# Patient Record
Sex: Female | Born: 2000 | Race: White | Hispanic: No | Marital: Single | Smoking: Never smoker
Health system: Southern US, Community
[De-identification: ages and names within clinical notes are randomized; demographics above are authoritative.]

## PROBLEM LIST (undated history)

## (undated) HISTORY — PX: APPENDECTOMY: SHX54

---
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
## 2020-05-20 ENCOUNTER — Other Ambulatory Visit: Payer: PRIVATE HEALTH INSURANCE

## 2020-05-20 DIAGNOSIS — Z20822 Contact with and (suspected) exposure to covid-19: Secondary | ICD-10-CM

## 2020-05-21 LAB — SARS-COV-2, NAA 2 DAY TAT

## 2020-05-21 LAB — NOVEL CORONAVIRUS, NAA: SARS-CoV-2, NAA: DETECTED — AB

## 2022-05-10 ENCOUNTER — Other Ambulatory Visit: Payer: Self-pay

## 2022-05-10 ENCOUNTER — Encounter: Payer: Self-pay | Admitting: Nurse Practitioner

## 2022-05-10 ENCOUNTER — Ambulatory Visit: Payer: No Typology Code available for payment source | Admitting: Nurse Practitioner

## 2022-05-10 VITALS — BP 130/80 | HR 92 | Temp 98.4°F | Resp 18 | Ht 66.0 in | Wt 224.6 lb

## 2022-05-10 DIAGNOSIS — Z114 Encounter for screening for human immunodeficiency virus [HIV]: Secondary | ICD-10-CM

## 2022-05-10 DIAGNOSIS — Z7689 Persons encountering health services in other specified circumstances: Secondary | ICD-10-CM

## 2022-05-10 DIAGNOSIS — Z6836 Body mass index (BMI) 36.0-36.9, adult: Secondary | ICD-10-CM

## 2022-05-10 DIAGNOSIS — I10 Essential (primary) hypertension: Secondary | ICD-10-CM

## 2022-05-10 DIAGNOSIS — Z23 Encounter for immunization: Secondary | ICD-10-CM | POA: Diagnosis not present

## 2022-05-10 DIAGNOSIS — Z3009 Encounter for other general counseling and advice on contraception: Secondary | ICD-10-CM

## 2022-05-10 DIAGNOSIS — Z13220 Encounter for screening for lipoid disorders: Secondary | ICD-10-CM

## 2022-05-10 DIAGNOSIS — Z1159 Encounter for screening for other viral diseases: Secondary | ICD-10-CM

## 2022-05-10 DIAGNOSIS — N946 Dysmenorrhea, unspecified: Secondary | ICD-10-CM

## 2022-05-10 DIAGNOSIS — F331 Major depressive disorder, recurrent, moderate: Secondary | ICD-10-CM

## 2022-05-10 DIAGNOSIS — Z131 Encounter for screening for diabetes mellitus: Secondary | ICD-10-CM

## 2022-05-10 LAB — COMPLETE METABOLIC PANEL WITH GFR
AG Ratio: 2.1 (calc) (ref 1.0–2.5)
ALT: 14 U/L (ref 6–29)
AST: 15 U/L (ref 10–30)
Albumin: 4.6 g/dL (ref 3.6–5.1)
Globulin: 2.2 g/dL (calc) (ref 1.9–3.7)

## 2022-05-10 LAB — CBC WITH DIFFERENTIAL/PLATELET
Basophils Absolute: 40 cells/uL (ref 0–200)
Basophils Relative: 0.5 %
Eosinophils Relative: 0.6 %
Lymphs Abs: 1501 cells/uL (ref 850–3900)
MCH: 31.8 pg (ref 27.0–33.0)
MCHC: 35.5 g/dL (ref 32.0–36.0)
MCV: 89.5 fL (ref 80.0–100.0)
MPV: 10.2 fL (ref 7.5–12.5)
Monocytes Relative: 6.6 %
Neutro Abs: 5791 cells/uL (ref 1500–7800)
Platelets: 250 10*3/uL (ref 140–400)

## 2022-05-10 LAB — LIPID PANEL
Cholesterol: 144 mg/dL (ref <200)
HDL: 41 mg/dL — ABNORMAL LOW (ref >=50)
LDL Cholesterol (Calc): 82 mg/dL (calc)
Non-HDL Cholesterol (Calc): 103 mg/dL (calc) (ref <130)
Total CHOL/HDL Ratio: 3.5 (calc) (ref <5.0)

## 2022-05-10 MED ORDER — ESCITALOPRAM OXALATE 10 MG PO TABS: 10.0000 mg | ORAL_TABLET | Freq: Every day | ORAL | 0 refills | Status: DC

## 2022-05-10 MED ORDER — LISINOPRIL 2.5 MG PO TABS: 2.5000 mg | ORAL_TABLET | Freq: Every day | ORAL | 0 refills | Status: DC

## 2022-05-10 NOTE — Progress Notes (Signed)
BP 130/80   Pulse 92   Temp 98.4 F (36.9 C) (Oral)   Resp 18   Ht 5\' 6"  (1.676 m)   Wt 224 lb 9.6 oz (101.9 kg)   LMP 04/21/2022   SpO2 99%   BMI 36.25 kg/m    Subjective:    Patient ID: Autumn [name], female    DOB: 11-09-2000, 21 y.o.   MRN: [id number]  HPI: Autumn [name] is a 21 y.o. 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  She has not had a pap. [facility]: end of august  Hypertension: She says [name] face was really red and headaches, she checked [name] blood pressure and it was 160s. She was seen at urgent care on 03/18/2022. She was diagnosed with hypertension and started on lisinopril 2.5 mg daily.  [name] blood pressure today is 130/80.  She says she has been out of medication for about 2 weeks.  Patient reports since being off the medication that [name] symptoms have returned. Will send in refills.   Obesity: [name] current weight is 224 lbs with a BMI of 36.25. She can work on eating well balanced with portion control and increasing physical activity.   Birth control/dysmenorrhea: [facility]: end of august, regular. She says that [name] cramps are bad and makes [name] nauseated. She says she would like to try birth control to help with symptoms.  She is also sexually active.  She would like to do an IUD. Will placed referral to GYN.   Depression/anxiety: She says she has struggled with anxiety and depression for years. She has never had it addressed.  [name] PHQ9 and GAD scores are positive. Discussed treatment options include therapy. Patient would like to try lexapro.  Discussed side effects and close follow up in 4 weeks.     05/10/2022    9:21 AM  Depression screen PHQ 2/9  Decreased Interest 2  Down, Depressed, Hopeless 2  PHQ - 2 Score 4  Altered sleeping 3  Tired, decreased energy 3  Change in appetite 3  Feeling bad or  failure about yourself  2  Trouble concentrating 2  Moving slowly or fidgety/restless 3  Suicidal thoughts 1  PHQ-9 Score 21  Difficult doing work/chores Very difficult       05/10/2022    9:23 AM  GAD 7 : Generalized Anxiety Score  Nervous, Anxious, on Edge 3  Control/stop worrying 2  Worry too much - different things 3  Trouble relaxing 3  Restless 3  Easily annoyed or irritable 3  Afraid - awful might happen 3  Total GAD 7 Score 20  Anxiety Difficulty Very difficult     Relevant past medical, surgical, family and social history reviewed and updated as indicated. Interim medical history since our last visit reviewed. Allergies and medications reviewed and updated.  Review of Systems  Constitutional: Negative for fever or weight change.  Respiratory: Negative for cough and shortness of breath.   Cardiovascular: Negative for chest pain or palpitations.  Gastrointestinal: Negative for abdominal pain, no bowel changes.  Musculoskeletal: Negative for gait problem or joint swelling.  Skin: Negative for rash.  Neurological: Negative for dizziness or headache.  No other specific complaints in a complete review of systems (except as listed in HPI above).  Objective:    BP 130/80   Pulse 92   Temp 98.4 F (36.9 C) (Oral)   Resp 18   Ht 5\' 6"  (1.676 m)   Wt 224 lb 9.6 oz (101.9 kg)   LMP 04/21/2022   SpO2 99%   BMI 36.25 kg/m   Wt Readings from Last 3 Encounters:  05/10/22 224 lb 9.6 oz (101.9 kg)  06/21/16 190 lb 9.6 oz (86.5 kg) (97 %, Z= 1.90)*   * Growth percentiles are based on CDC (Girls, 2-20 Years) data.    Physical Exam  Constitutional: Patient appears well-developed and well-nourished. Obese  No distress.  HEENT: head atraumatic, normocephalic, pupils equal and reactive to light, neck supple Cardiovascular: Normal rate, regular rhythm and normal heart sounds.  No murmur heard. No BLE edema. Pulmonary/Chest: Effort normal and breath sounds normal. No  respiratory distress. Abdominal: Soft.  There is no tenderness. Psychiatric: Patient has a normal mood and affect. behavior is normal. Judgment and thought content normal.  Results for orders placed or performed in visit on 05/20/20  SARS-COV-2, NAA 2 DAY TAT   Nasopharynge  Screenin  Result Value Ref Range   SARS-CoV-2, NAA 2 DAY TAT Performed   Novel Coronavirus, NAA ([facility])   Specimen: Nasopharyngeal(NP) swabs in vial transport medium   Nasopharynge  Screenin  Result Value Ref Range   SARS-CoV-2, NAA Detected (A) Not Detected      Assessment & Plan:   Problem List Items Addressed This Visit       Cardiovascular and Mediastinum   Hypertension - Primary    Continue taking lisinopril 2.5 mg daily.  Try and reduce sodium in your diet.       Relevant Medications   lisinopril (ZESTRIL) 2.5 MG tablet   Other Relevant Orders   CBC with Differential/Platelet   COMPLETE METABOLIC PANEL WITH GFR     Other   Class 2 severe obesity due to excess calories with serious comorbidity and body mass index (BMI) of 36.0 to 36.9 in adult [facility])    Work on increasing physical activity, well balanced diet with portion control.       Other Visit Diagnoses     Need for Tdap vaccination       Relevant Orders   Tdap vaccine greater than or equal to 7yo IM (Completed)   Encounter to establish care       schedule cpe    Birth control counseling       Relevant Orders   Ambulatory referral to Gynecology   Encounter for hepatitis C screening test for low risk patient       Relevant Orders   Hepatitis C antibody   Screening for HIV without presence of risk factors       Relevant Orders   HIV Antibody (routine testing w rflx)   Screening for cholesterol level       Relevant Orders   Lipid panel   Screening for diabetes mellitus       Relevant Orders   COMPLETE METABOLIC PANEL WITH GFR   Hemoglobin A1c   Moderate episode of recurrent major depressive disorder ([place])       Relevant  Medications   escitalopram (LEXAPRO) 10 MG tablet   Dysmenorrhea       Relevant Orders   Ambulatory referral to Gynecology        Follow up plan: Return in about 4 weeks (around 06/07/2022) for follow up.

## 2022-05-10 NOTE — Assessment & Plan Note (Signed)
Work on increasing physical activity, well balanced diet with portion control.

## 2022-05-10 NOTE — Assessment & Plan Note (Signed)
Continue taking lisinopril 2.5 mg daily.  Try and reduce sodium in your diet.

## 2022-05-11 LAB — CBC WITH DIFFERENTIAL/PLATELET
Absolute Monocytes: 521 cells/uL (ref 200–950)
Eosinophils Absolute: 47 cells/uL (ref 15–500)
HCT: 42.5 % (ref 35.0–45.0)
Hemoglobin: 15.1 g/dL (ref 11.7–15.5)
Neutrophils Relative %: 73.3 %
RBC: 4.75 10*6/uL (ref 3.80–5.10)
RDW: 11.8 % (ref 11.0–15.0)
Total Lymphocyte: 19 %
WBC: 7.9 10*3/uL (ref 3.8–10.8)

## 2022-05-11 LAB — COMPLETE METABOLIC PANEL WITH GFR
Alkaline phosphatase (APISO): 58 U/L (ref 31–125)
BUN: 11 mg/dL (ref 7–25)
CO2: 29 mmol/L (ref 20–32)
Calcium: 9.7 mg/dL (ref 8.6–10.2)
Chloride: 105 mmol/L (ref 98–110)
Creat: 0.81 mg/dL (ref 0.50–0.96)
Glucose, Bld: 65 mg/dL (ref 65–99)
Potassium: 4.5 mmol/L (ref 3.5–5.3)
Sodium: 141 mmol/L (ref 135–146)
Total Bilirubin: 0.6 mg/dL (ref 0.2–1.2)
Total Protein: 6.8 g/dL (ref 6.1–8.1)
eGFR: 105 mL/min/{1.73_m2} (ref >=60)

## 2022-05-11 LAB — HEMOGLOBIN A1C
Hgb A1c MFr Bld: 4.4 % of total Hgb (ref <5.7)
Mean Plasma Glucose: 80 mg/dL
eAG (mmol/L): 4.4 mmol/L

## 2022-05-11 LAB — LIPID PANEL: Triglycerides: 117 mg/dL (ref <150)

## 2022-05-11 LAB — HEPATITIS C ANTIBODY: Hepatitis C Ab: NONREACTIVE

## 2022-05-11 LAB — HIV ANTIBODY (ROUTINE TESTING W REFLEX): HIV 1&2 Ab, 4th Generation: NONREACTIVE

## 2022-05-14 ENCOUNTER — Telehealth: Payer: Self-pay | Admitting: Obstetrics and Gynecology

## 2022-05-14 NOTE — Telephone Encounter (Signed)
[facility] referring for Birth control counseling,Dysmenorrhea, interested in IUD prefers female provider. I contacted patient via phone. I left voicemail for patient to call back to be scheduled.

## 2022-05-14 NOTE — Telephone Encounter (Signed)
Patient is scheduled for 05/26/22 with CJE

## 2022-05-26 ENCOUNTER — Encounter: Payer: No Typology Code available for payment source | Admitting: Obstetrics & Gynecology

## 2022-06-01 ENCOUNTER — Other Ambulatory Visit: Payer: Self-pay | Admitting: Nurse Practitioner

## 2022-06-01 DIAGNOSIS — F331 Major depressive disorder, recurrent, moderate: Secondary | ICD-10-CM

## 2022-06-01 NOTE — Telephone Encounter (Signed)
Requested medications are due for refill today.  Yes  Requested medications are on the active medications list.  yes  Last refill. 05/10/2022 #30 0 rf  Future visit scheduled.   Yes - in 6 days  Notes to clinic.  Pharmacy needs dx code. This is a new medication to pt. Please review for refill.    Requested Prescriptions  Pending Prescriptions Disp Refills   escitalopram (LEXAPRO) 10 MG tablet [Pharmacy Med Name: ESCITALOPRAM 10 MG TABLET] 90 tablet 1    Sig: TAKE 1 TABLET BY MOUTH EVERY DAY     Psychiatry:  Antidepressants - SSRI Passed - 06/01/2022  1:32 PM      Passed - Valid encounter within last 6 months    Recent Outpatient Visits           3 weeks ago Hypertension, unspecified type   [place], FNP       Future Appointments             In 6 days Reece Packer, Myna Hidalgo, [facility], [facility]

## 2022-06-07 ENCOUNTER — Encounter: Payer: Self-pay | Admitting: Nurse Practitioner

## 2022-06-07 ENCOUNTER — Ambulatory Visit: Payer: No Typology Code available for payment source | Admitting: Nurse Practitioner

## 2022-06-07 ENCOUNTER — Other Ambulatory Visit: Payer: Self-pay

## 2022-06-07 VITALS — BP 124/76 | HR 98 | Temp 98.4°F | Resp 18 | Ht 66.0 in | Wt 224.8 lb

## 2022-06-07 DIAGNOSIS — F419 Anxiety disorder, unspecified: Secondary | ICD-10-CM | POA: Diagnosis not present

## 2022-06-07 DIAGNOSIS — F331 Major depressive disorder, recurrent, moderate: Secondary | ICD-10-CM | POA: Insufficient documentation

## 2022-06-07 DIAGNOSIS — K219 Gastro-esophageal reflux disease without esophagitis: Secondary | ICD-10-CM | POA: Diagnosis not present

## 2022-06-07 MED ORDER — ESCITALOPRAM OXALATE 20 MG PO TABS: 20.0000 mg | ORAL_TABLET | Freq: Every day | ORAL | 0 refills | Status: DC

## 2022-06-07 MED ORDER — OMEPRAZOLE 20 MG PO CPDR: 20.0000 mg | DELAYED_RELEASE_CAPSULE | Freq: Every day | ORAL | 3 refills | Status: AC

## 2022-06-07 NOTE — Assessment & Plan Note (Signed)
Increase Lexapro to 20mg daily.  Follow up in 4 weeks. 

## 2022-06-07 NOTE — Assessment & Plan Note (Signed)
Start taking omeprazole 20 mg daily.  Try to identify food triggers and avoid them.  Do not lay down an hour after eating.

## 2022-06-07 NOTE — Progress Notes (Signed)
BP 124/76   Pulse 98   Temp 98.4 F (36.9 C) (Oral)   Resp 18   Ht _0  (1.676 m)   Wt 224 lb 12.8 oz (102 kg)   LMP 05/26/2022   SpO2 97%   BMI 36.28 kg/m    Subjective:    Patient ID: Autumn [name], female    DOB: 06/30/2001, 21 y.o.   MRN: [id number]  HPI: Autumn [name] is a 21 y.o. female  Chief Complaint  Patient presents with   Hypertension   Depression   Depression/anxiety: Patient reports that she is struggled with anxiety and depression for many years.  But it was never addressed.  Last appointment we started patient on Lexapro 10 mg daily.  Back today for 4-week follow-up. Patient's PHQ 9 and GAD scores have improved slightly. She says she feels like the medication does not work all day long.  Discussed increasing dose to 20 mg daily.      06/07/2022   10:23 AM 05/10/2022    9:21 AM  Depression screen PHQ 2/9  Decreased Interest 2 2  Down, Depressed, Hopeless 2 2  PHQ - 2 Score 4 4  Altered sleeping 3 3  Tired, decreased energy 2 3  Change in appetite 3 3  Feeling bad or failure about yourself  1 2  Trouble concentrating 2 2  Moving slowly or fidgety/restless 2 3  Suicidal thoughts 1 1  PHQ-9 Score 18 21  Difficult doing work/chores Somewhat difficult Very difficult       06/07/2022   10:26 AM 05/10/2022    9:23 AM  GAD 7 : Generalized Anxiety Score  Nervous, Anxious, on Edge 3 3  Control/stop worrying 2 2  Worry too much - different things 2 3  Trouble relaxing 1 3  Restless 1 3  Easily annoyed or irritable 2 3  Afraid - awful might happen 1 3  Total GAD 7 Score 12 20  Anxiety Difficulty Somewhat difficult Very difficult   GERD:  patient reports that she has been having a lot of acid reflux lately.  Last night it was the worst and she ended up throwing up.  She says she can feel the burning in [name] chest. She says she did try an omeprazole 20 mg which helped. Discussed avoiding triggers, not laying down an hour after eating. Will prescribe  omeprazole.     Relevant past medical, surgical, family and social history reviewed and updated as indicated. Interim medical history since our last visit reviewed. Allergies and medications reviewed and updated.  Review of Systems  Constitutional: Negative for fever or weight change.  Respiratory: Negative for cough and shortness of breath.   Cardiovascular: Negative for chest pain or palpitations.  Gastrointestinal: Negative for abdominal pain, no bowel changes.  Musculoskeletal: Negative for gait problem or joint swelling.  Skin: Negative for rash.  Neurological: Negative for dizziness or headache.  No other specific complaints in a complete review of systems (except as listed in HPI above).      Objective:    BP 124/76   Pulse 98   Temp 98.4 F (36.9 C) (Oral)   Resp 18   Ht _1  (1.676 m)   Wt 224 lb 12.8 oz (102 kg)   LMP 05/26/2022   SpO2 97%   BMI 36.28 kg/m   Wt Readings from Last 3 Encounters:  06/07/22 224 lb 12.8 oz (102 kg)  05/10/22 224 lb 9.6 oz (101.9 kg)  06/21/16 190  lb 9.6 oz (86.5 kg) (97 %, Z= 1.90)*   * Growth percentiles are based on CDC (Girls, 2-20 Years) data.    Physical Exam  Constitutional: Patient appears well-developed and well-nourished. Obese  No distress.  HEENT: head atraumatic, normocephalic, pupils equal and reactive to light, neck supple Cardiovascular: Normal rate, regular rhythm and normal heart sounds.  No murmur heard. No BLE edema. Pulmonary/Chest: Effort normal and breath sounds normal. No respiratory distress. Abdominal: Soft.  There is no tenderness. Psychiatric: Patient has a normal mood and affect. behavior is normal. Judgment and thought content normal.  Results for orders placed or performed in visit on 05/10/22  CBC with Differential/Platelet  Result Value Ref Range   WBC 7.9 3.8 - 10.8 Thousand/uL   RBC 4.75 3.80 - 5.10 Million/uL   Hemoglobin 15.1 11.7 - 15.5 g/dL   HCT 42.5 35.0 - 45.0 %   MCV 89.5 80.0 - 100.0  fL   MCH 31.8 27.0 - 33.0 pg   MCHC 35.5 32.0 - 36.0 g/dL   RDW 11.8 11.0 - 15.0 %   Platelets 250 140 - 400 Thousand/uL   MPV 10.2 7.5 - 12.5 fL   Neutro Abs 5,791 1,500 - 7,800 cells/uL   Lymphs Abs 1,501 850 - 3,900 cells/uL   Absolute Monocytes 521 200 - 950 cells/uL   Eosinophils Absolute 47 15 - 500 cells/uL   Basophils Absolute 40 0 - 200 cells/uL   Neutrophils Relative % 73.3 %   Total Lymphocyte 19.0 %   Monocytes Relative 6.6 %   Eosinophils Relative 0.6 %   Basophils Relative 0.5 %  COMPLETE METABOLIC PANEL WITH GFR  Result Value Ref Range   Glucose, Bld 65 65 - 99 mg/dL   BUN 11 7 - 25 mg/dL   Creat 0.81 0.50 - 0.96 mg/dL   eGFR 105 > OR = 60 mL/min/1.71m   BUN/Creatinine Ratio SEE NOTE: 6 - 22 (calc)   Sodium 141 135 - 146 mmol/L   Potassium 4.5 3.5 - 5.3 mmol/L   Chloride 105 98 - 110 mmol/L   CO2 29 20 - 32 mmol/L   Calcium 9.7 8.6 - 10.2 mg/dL   Total Protein 6.8 6.1 - 8.1 g/dL   Albumin 4.6 3.6 - 5.1 g/dL   Globulin 2.2 1.9 - 3.7 g/dL (calc)   AG Ratio 2.1 1.0 - 2.5 (calc)   Total Bilirubin 0.6 0.2 - 1.2 mg/dL   Alkaline phosphatase (APISO) 58 31 - 125 U/L   AST 15 10 - 30 U/L   ALT 14 6 - 29 U/L  Lipid panel  Result Value Ref Range   Cholesterol 144 <200 mg/dL   HDL 41 (L) > OR = 50 mg/dL   Triglycerides 117 <150 mg/dL   LDL Cholesterol (Calc) 82 mg/dL (calc)   Total CHOL/HDL Ratio 3.5 <5.0 (calc)   Non-HDL Cholesterol (Calc) 103 <130 mg/dL (calc)  Hemoglobin A1c  Result Value Ref Range   Hgb A1c MFr Bld 4.4 <5.7 % of total Hgb   Mean Plasma Glucose 80 mg/dL   eAG (mmol/L) 4.4 mmol/L  Hepatitis C antibody  Result Value Ref Range   Hepatitis C Ab NON-REACTIVE NON-REACTIVE  HIV Antibody (routine testing w rflx)  Result Value Ref Range   HIV 1&2 Ab, 4th Generation NON-REACTIVE NON-REACTIVE      Assessment & Plan:   Problem List Items Addressed This Visit       Digestive   Gastroesophageal reflux disease without esophagitis  Start  taking omeprazole 20 mg daily.  Try to identify food triggers and avoid them.  Do not lay down an hour after eating.      Relevant Medications   omeprazole (PRILOSEC) 20 MG capsule     Other   Moderate episode of recurrent major depressive disorder (HCC) - Primary    Increase Lexapro to 20 mg daily.  Follow-up in 4 weeks      Relevant Medications   escitalopram (LEXAPRO) 20 MG tablet   Anxiety    Increase Lexapro to 20 mg daily.  Follow-up in 4 weeks      Relevant Medications   escitalopram (LEXAPRO) 20 MG tablet     Follow up plan: Return in about 4 weeks (around 07/05/2022) for follow up.

## 2022-06-21 NOTE — Progress Notes (Unsigned)
    Bo Merino, FNP   No chief complaint on file.   HPI:      Ms. Autumn [name] is a 21 y.o. No obstetric history on file. whose LMP was Patient's last menstrual period was 05/26/2022., presents today for [facility] consult, referred by PCP Needs pap/STD testing?   Patient Active Problem List   Diagnosis Date Noted   Moderate episode of recurrent major depressive disorder ([place]) 06/07/2022   Anxiety 06/07/2022   Gastroesophageal reflux disease without esophagitis 06/07/2022   Hypertension 05/10/2022   Class 2 severe obesity due to excess calories with serious comorbidity and body mass index (BMI) of 36.0 to 36.9 in adult [facility]) 05/10/2022    Past Surgical History:  Procedure Laterality Date   APPENDECTOMY      No family history on file.  Social History   Socioeconomic History   Marital status: Single    Spouse name: Not on file   Number of children: Not on file   Years of education: Not on file   Highest education level: Not on file  Occupational History   Not on file  Tobacco Use   Smoking status: Never   Smokeless tobacco: Never  Vaping Use   Vaping Use: Every day  Substance and Sexual Activity   Alcohol use: No   Drug use: Never   Sexual activity: Yes  Other Topics Concern   Not on file  Social History Narrative   Not on file   Social Determinants of Health   Financial Resource Strain: Not on file  Food Insecurity: Not on file  Transportation Needs: Not on file  Physical Activity: Not on file  Stress: Not on file  Social Connections: Not on file  Intimate Partner Violence: Not on file    Outpatient Medications Prior to Visit  Medication Sig Dispense Refill   escitalopram (LEXAPRO) 20 MG tablet Take 1 tablet (20 mg total) by mouth daily. 30 tablet 0   lisinopril (ZESTRIL) 2.5 MG tablet Take 1 tablet (2.5 mg total) by mouth daily. 90 tablet 0   omeprazole (PRILOSEC) 20 MG capsule Take 1 capsule (20 mg total) by mouth daily. 90 capsule 3   No  facility-administered medications prior to visit.      ROS:  Review of Systems BREAST: No symptoms   OBJECTIVE:   Vitals:  LMP 05/26/2022   Physical Exam  Results: No results found for this or any previous visit (from the past 24 hour(s)).   Assessment/Plan: No diagnosis found.    No orders of the defined types were placed in this encounter.     No follow-ups on file.  Roth Ress B. Gotham Raden, PA-C 06/21/2022 1:47 PM

## 2022-06-22 ENCOUNTER — Ambulatory Visit: Payer: No Typology Code available for payment source | Admitting: Obstetrics and Gynecology

## 2022-06-22 ENCOUNTER — Encounter: Payer: Self-pay | Admitting: Obstetrics and Gynecology

## 2022-06-22 ENCOUNTER — Other Ambulatory Visit (HOSPITAL_COMMUNITY)
Admission: RE | Admit: 2022-06-22 | Discharge: 2022-06-22 | Disposition: A | Discharge status: Home / Self Care | Payer: No Typology Code available for payment source | Source: Ambulatory Visit | Attending: Obstetrics & Gynecology | Admitting: Obstetrics & Gynecology

## 2022-06-22 VITALS — BP 112/69 | HR 64 | Ht 66.0 in | Wt 227.0 lb

## 2022-06-22 DIAGNOSIS — Z3009 Encounter for other general counseling and advice on contraception: Secondary | ICD-10-CM | POA: Diagnosis not present

## 2022-06-22 DIAGNOSIS — Z124 Encounter for screening for malignant neoplasm of cervix: Secondary | ICD-10-CM

## 2022-06-22 DIAGNOSIS — N921 Excessive and frequent menstruation with irregular cycle: Secondary | ICD-10-CM | POA: Insufficient documentation

## 2022-06-22 DIAGNOSIS — Z113 Encounter for screening for infections with a predominantly sexual mode of transmission: Secondary | ICD-10-CM

## 2022-06-22 DIAGNOSIS — Z30014 Encounter for initial prescription of intrauterine contraceptive device: Secondary | ICD-10-CM

## 2022-06-22 MED ORDER — NAPROXEN SODIUM 550 MG PO TABS: 550.0000 mg | ORAL_TABLET | Freq: Two times a day (BID) | ORAL | 1 refills | Status: AC | PRN

## 2022-06-22 MED ORDER — MISOPROSTOL 100 MCG PO TABS: 100.0000 ug | ORAL_TABLET | Freq: Once | ORAL | 0 refills | Status: DC

## 2022-06-23 LAB — CYTOLOGY - PAP
Chlamydia: NEGATIVE
Comment: NEGATIVE
Comment: NORMAL
Diagnosis: NEGATIVE
Neisseria Gonorrhea: NEGATIVE

## 2022-06-25 ENCOUNTER — Ambulatory Visit: Payer: No Typology Code available for payment source | Admitting: Advanced Practice Midwife

## 2022-06-25 ENCOUNTER — Encounter: Payer: Self-pay | Admitting: Advanced Practice Midwife

## 2022-06-25 VITALS — BP 120/80 | Ht 66.0 in | Wt 226.0 lb

## 2022-06-25 DIAGNOSIS — Z30430 Encounter for insertion of intrauterine contraceptive device: Secondary | ICD-10-CM | POA: Diagnosis not present

## 2022-06-25 MED ORDER — LEVONORGESTREL 19.5 MG IU IUD: INTRAUTERINE_SYSTEM | Freq: Once | INTRAUTERINE | Status: AC

## 2022-06-26 ENCOUNTER — Encounter: Payer: Self-pay | Admitting: Advanced Practice Midwife

## 2022-06-26 NOTE — Progress Notes (Signed)
Date of Service: 06/25/2022  GYNECOLOGY OFFICE PROCEDURE NOTE  Autumn [name] is a 21 y.o. G0P0000 here for [place] IUD insertion. No GYN concerns.  Last pap smear was on 06/22/22 and was normal.  The patient is currently using condoms for contraception and her LMP is Patient's last menstrual period was 05/26/2022.Marland Kitchen  The indication for her IUD is contraception/cycle control.  Review of Systems  Constitutional:  Negative for chills and fever.  HENT:  Negative for congestion, ear discharge, ear pain, hearing loss, sinus pain and sore throat.   Eyes:  Negative for blurred vision and double vision.  Respiratory:  Negative for cough, shortness of breath and wheezing.   Cardiovascular:  Negative for chest pain, palpitations and leg swelling.  Gastrointestinal:  Negative for abdominal pain, blood in stool, constipation, diarrhea, heartburn, melena, nausea and vomiting.  Genitourinary:  Negative for dysuria, flank pain, frequency, hematuria and urgency.  Musculoskeletal:  Negative for back pain, joint pain and myalgias.  Skin:  Negative for itching and rash.  Neurological:  Negative for dizziness, tingling, tremors, sensory change, speech change, focal weakness, seizures, loss of consciousness, weakness and headaches.  Endo/Heme/Allergies:  Negative for environmental allergies. Does not bruise/bleed easily.  Psychiatric/Behavioral:  Negative for depression, hallucinations, memory loss, substance abuse and suicidal ideas. The patient is not nervous/anxious and does not have insomnia.     Vital Signs: BP 120/80   Ht 5\' 6"  (1.676 m)   Wt 226 lb (102.5 kg)   LMP 06/24/2022   BMI 36.48 kg/m  Constitutional: Well nourished, well developed female in no acute distress.  HEENT: normal Skin: Warm and dry.   Extremity:  no edema   Respiratory:  Normal respiratory effort Psych: Alert and Oriented x3. No memory deficits. Normal mood and affect.    Pelvic exam: (female chaperone present) is not limited  by body habitus EGBUS: within normal limits Vagina: within normal limits and with normal mucosa, blood in the vault Cervix: normal appearance   IUD Insertion Procedure Note Patient identified, informed consent performed, consent signed.   Discussed risks of irregular bleeding, cramping, infection, malpositioning, expulsion or uterine perforation of the IUD (1:1000 placements)  which may require further procedure such as laparoscopy.  IUD while effective at preventing pregnancy do not prevent transmission of sexually transmitted diseases and use of barrier methods for this purpose was discussed. Time out was performed.    Speculum placed in the vagina.  Cervix visualized.  Cleaned with Betadine x 2.  Grasped anteriorly with a single tooth tenaculum.  Uterus sounded to 7 cm. IUD placed per manufacturer's recommendations.  Strings trimmed to 4 cm (difficult to access using plastic speculum. Tenaculum was removed, good hemostasis noted.  Patient tolerated procedure well.   Patient was given post-procedure instructions.  She was advised to have backup contraception for one week.  Patient was also asked to check IUD strings periodically and follow up in 4- 6 weeks for IUD check.  IUD insertion CPT 58300,  Skyla J7301 Mirena J7298 [place] [id number] H4193 Verdia Kuba X9024 Modifer 25, plus Modifer 79 is done during a global billing visit    Rod Can, [facility]  06/26/2022, 1:35 PM

## 2022-06-28 ENCOUNTER — Encounter: Payer: Self-pay | Admitting: Advanced Practice Midwife

## 2022-06-29 ENCOUNTER — Other Ambulatory Visit: Payer: Self-pay | Admitting: Nurse Practitioner

## 2022-06-29 DIAGNOSIS — F331 Major depressive disorder, recurrent, moderate: Secondary | ICD-10-CM

## 2022-06-29 DIAGNOSIS — F419 Anxiety disorder, unspecified: Secondary | ICD-10-CM

## 2022-06-29 NOTE — Telephone Encounter (Signed)
Requested medication (s) are due for refill today:   Yes  Requested medication (s) are on the active medication list:   Yes  Future visit scheduled:   Yes   Last ordered: 06/07/2022 #30, 0 refills  Returned because a 90 day supply and DX Code are being requested   Requested Prescriptions  Pending Prescriptions Disp Refills   escitalopram (LEXAPRO) 20 MG tablet [Pharmacy Med Name: ESCITALOPRAM 20 MG TABLET] 90 tablet 1    Sig: TAKE 1 TABLET BY [place]     Psychiatry:  Antidepressants - SSRI Passed - 06/29/2022  1:33 PM      Passed - Completed PHQ-2 or PHQ-9 in the last 360 days      Passed - Valid encounter within last 6 months    Recent Outpatient Visits           3 weeks ago Moderate episode of recurrent major depressive disorder [facility])   [facility] Bo Merino, FNP   1 month ago Hypertension, unspecified type   [facility] Bo Merino, FNP       Future Appointments             In 6 days Reece Packer, Myna Hidalgo, [facility], [facility]

## 2022-07-05 ENCOUNTER — Other Ambulatory Visit: Payer: Self-pay

## 2022-07-05 ENCOUNTER — Encounter: Payer: Self-pay | Admitting: Nurse Practitioner

## 2022-07-05 ENCOUNTER — Telehealth (INDEPENDENT_AMBULATORY_CARE_PROVIDER_SITE_OTHER): Payer: No Typology Code available for payment source | Admitting: Nurse Practitioner

## 2022-07-05 DIAGNOSIS — F331 Major depressive disorder, recurrent, moderate: Secondary | ICD-10-CM

## 2022-07-05 DIAGNOSIS — F419 Anxiety disorder, unspecified: Secondary | ICD-10-CM

## 2022-07-05 MED ORDER — ESCITALOPRAM OXALATE 20 MG PO TABS: 20.0000 mg | ORAL_TABLET | Freq: Every day | ORAL | 1 refills | Status: DC

## 2022-07-05 NOTE — Progress Notes (Signed)
Name: Autumn [name]   MRN: [id number]    DOB: 11-21-2000   Date:07/05/2022       Progress Note  Subjective  Chief Complaint  Chief Complaint  Patient presents with   Depression    4 week recheck   Anxiety    I connected with  Autumn [name]  on 07/05/22 at  9:00 AM EST by a video enabled telemedicine application and verified that I am speaking with the correct person using two identifiers.  I discussed the limitations of evaluation and management by telemedicine and the availability of in person appointments. The patient expressed understanding and agreed to proceed with a virtual visit  Staff also discussed with the patient that there may be a patient responsible charge related to this service. Patient Location: home Provider Location: [facility] Additional Individuals present: alone  HPI  Depression/anxiety: Last appointment we increased her Lexapro from 10 mg to 20 mg.  Patient PHQ9 and GAD scores have improved.  She reports that she is doing much better. She says she notices at night she does get really tired.  Patient reports she wants to continue with current dose.  Will send in refills.     07/05/2022    8:35 AM 06/07/2022   10:23 AM 05/10/2022    9:21 AM  Depression screen PHQ 2/9  Decreased Interest 0 2 2  Down, Depressed, Hopeless 1 2 2   PHQ - 2 Score 1 4 4   Altered sleeping 3 3 3   Tired, decreased energy 0 2 3  Change in appetite 0 3 3  Feeling bad or failure about yourself  0 1 2  Trouble concentrating 1 2 2   Moving slowly or fidgety/restless 0 2 3  Suicidal thoughts 0 1 1  PHQ-9 Score 5 18 21   Difficult doing work/chores Not difficult at all Somewhat difficult Very difficult       07/05/2022    8:37 AM 06/07/2022   10:26 AM 05/10/2022    9:23 AM  GAD 7 : Generalized Anxiety Score  Nervous, Anxious, on Edge 1 3 3   Control/stop worrying 1 2 2   Worry too much - different things 1 2 3   Trouble relaxing 1 1 3   Restless 1 1 3   Easily annoyed or irritable 0 2 3   Afraid - awful might happen 1 1 3   Total GAD 7 Score 6 12 20   Anxiety Difficulty Somewhat difficult Somewhat difficult Very difficult     Patient Active Problem List   Diagnosis Date Noted   Menometrorrhagia 06/22/2022   Moderate episode of recurrent major depressive disorder (HCC) 06/07/2022   Anxiety 06/07/2022   Gastroesophageal reflux disease without esophagitis 06/07/2022   Hypertension 05/10/2022   Class 2 severe obesity due to excess calories with serious comorbidity and body mass index (BMI) of 36.0 to 36.9 in adult [facility]) 05/10/2022    Social History   Tobacco Use   Smoking status: Never   Smokeless tobacco: Never  Substance Use Topics   Alcohol use: No     Current Outpatient Medications:    lisinopril (ZESTRIL) 2.5 MG tablet, Take 1 tablet (2.5 mg total) by mouth daily., Disp: 90 tablet, Rfl: 0   naproxen sodium (ANAPROX DS) 550 MG tablet, Take 1 tablet (550 mg total) by mouth 2 (two) times daily as needed., Disp: 30 tablet, Rfl: 1   omeprazole (PRILOSEC) 20 MG capsule, Take 1 capsule (20 mg total) by mouth daily., Disp: 90 capsule, Rfl: 3   escitalopram (LEXAPRO)  20 MG tablet, Take 1 tablet (20 mg total) by mouth daily., Disp: 90 tablet, Rfl: 1  No Known Allergies  I personally reviewed active problem list, medication list, allergies, notes from last encounter with the patient/caregiver today.  ROS  Constitutional: Negative for fever or weight change.  Respiratory: Negative for cough and shortness of breath.   Cardiovascular: Negative for chest pain or palpitations.  Gastrointestinal: Negative for abdominal pain, no bowel changes.  Musculoskeletal: Negative for gait problem or joint swelling.  Skin: Negative for rash.  Neurological: Negative for dizziness or headache.  No other specific complaints in a complete review of systems (except as listed in HPI above).   Objective  Virtual encounter, vitals not obtained.  There is no height or weight on file to  calculate BMI.  Nursing Note and Vital Signs reviewed.  Physical Exam  Awake, alert and oriented, speaking in complete senteces  No results found for this or any previous visit (from the past 72 hour(s)).  Assessment & Plan  Problem List Items Addressed This Visit       Other   Moderate episode of recurrent major depressive disorder (HCC) - Primary    Continue taking lexapro 20 mg daily      Relevant Medications   escitalopram (LEXAPRO) 20 MG tablet   Anxiety    Continue taking lexapro 20 mg daily      Relevant Medications   escitalopram (LEXAPRO) 20 MG tablet     -Red flags and when to present for emergency care or RTC including fever >101.22F, chest pain, shortness of breath, new/worsening/un-resolving symptoms,  reviewed with patient at time of visit. Follow up and care instructions discussed and provided in AVS. - I discussed the assessment and treatment plan with the patient. The patient was provided an opportunity to ask questions and all were answered. The patient agreed with the plan and demonstrated an understanding of the instructions.  I provided 15 minutes of non-face-to-face time during this encounter.  Berniece Salines, FNP

## 2022-07-05 NOTE — Assessment & Plan Note (Signed)
Continue taking lexapro 20 mg daily 

## 2022-07-05 NOTE — Assessment & Plan Note (Signed)
Continue taking lexapro 20 mg daily

## 2022-07-21 ENCOUNTER — Ambulatory Visit: Payer: No Typology Code available for payment source | Admitting: Advanced Practice Midwife

## 2022-08-27 ENCOUNTER — Other Ambulatory Visit: Payer: Self-pay | Admitting: Nurse Practitioner

## 2022-08-27 DIAGNOSIS — F331 Major depressive disorder, recurrent, moderate: Secondary | ICD-10-CM

## 2022-08-27 DIAGNOSIS — I10 Essential (primary) hypertension: Secondary | ICD-10-CM

## 2022-08-27 DIAGNOSIS — F419 Anxiety disorder, unspecified: Secondary | ICD-10-CM

## 2022-08-27 MED ORDER — LISINOPRIL 2.5 MG PO TABS: 2.5000 mg | ORAL_TABLET | Freq: Every day | ORAL | 1 refills | Status: DC

## 2022-08-27 NOTE — Telephone Encounter (Signed)
Requested Prescriptions  Pending Prescriptions Disp Refills   escitalopram (LEXAPRO) 20 MG tablet 90 tablet 1    Sig: Take 1 tablet (20 mg total) by mouth daily.     Psychiatry:  Antidepressants - SSRI Passed - 08/27/2022  1:16 PM      Passed - Completed PHQ-2 or PHQ-9 in the last 360 days      Passed - Valid encounter within last 6 months    Recent Outpatient Visits           1 month ago Moderate episode of recurrent major depressive disorder [facility])   [facility] Serafina Royals F, FNP   2 months ago Moderate episode of recurrent major depressive disorder [facility])   [facility] Serafina Royals F, FNP   3 months ago Hypertension, unspecified type   [facility] [place], Myna Hidalgo, FNP               lisinopril (ZESTRIL) 2.5 MG tablet 90 tablet 1    Sig: Take 1 tablet (2.5 mg total) by mouth daily.     Cardiovascular:  ACE Inhibitors Passed - 08/27/2022  1:16 PM      Passed - Cr in normal range and within 180 days    Creat  Date Value Ref Range Status  05/10/2022 0.81 0.50 - 0.96 mg/dL Final         Passed - K in normal range and within 180 days    Potassium  Date Value Ref Range Status  05/10/2022 4.5 3.5 - 5.3 mmol/L Final         Passed - Patient is not pregnant      Passed - Last BP in normal range    BP Readings from Last 1 Encounters:  06/25/22 120/80         Passed - Valid encounter within last 6 months    Recent Outpatient Visits           1 month ago Moderate episode of recurrent major depressive disorder [facility])   [facility] Serafina Royals F, FNP   2 months ago Moderate episode of recurrent major depressive disorder [facility])   [place], FNP   3 months ago Hypertension, unspecified type   [facility], Myna Hidalgo, [place]

## 2022-08-27 NOTE — Telephone Encounter (Signed)
Medication Refill - Medication: escitalopram (LEXAPRO) 20 MG tablet and lisinopril (ZESTRIL) 2.5 MG tablet   Has the patient contacted their pharmacy? Yes.   Pt told to contact provider  Preferred Pharmacy (with phone number or street name):  [id number] - [place], [place] Phone: [phone]  Fax: [phone]     Has the patient been seen for an appointment in the last year OR does the patient have an upcoming appointment? Yes.    Agent: Please be advised that RX refills may take up to 3 business days. We ask that you follow-up with your pharmacy.

## 2022-12-29 ENCOUNTER — Other Ambulatory Visit: Payer: Self-pay | Admitting: Nurse Practitioner

## 2022-12-29 DIAGNOSIS — F419 Anxiety disorder, unspecified: Secondary | ICD-10-CM

## 2022-12-29 DIAGNOSIS — F331 Major depressive disorder, recurrent, moderate: Secondary | ICD-10-CM

## 2022-12-29 NOTE — Telephone Encounter (Signed)
Requested Prescriptions  Pending Prescriptions Disp Refills   escitalopram (LEXAPRO) 20 MG tablet [Pharmacy Med Name: ESCITALOPRAM 20 MG TABLET] 90 tablet 0    Sig: TAKE 1 TABLET BY MOUTH EVERY DAY     Psychiatry:  Antidepressants - SSRI Passed - 12/29/2022  2:25 AM      Passed - Completed PHQ-2 or PHQ-9 in the last 360 days      Passed - Valid encounter within last 6 months    Recent Outpatient Visits           5 months ago Moderate episode of recurrent major depressive disorder [facility])   [facility] Della Goo F, FNP   6 months ago Moderate episode of recurrent major depressive disorder [facility])   [facility] Berniece Salines, FNP   7 months ago Hypertension, unspecified type   [facility] Berniece Salines, [place]

## 2023-03-01 ENCOUNTER — Encounter: Payer: Self-pay | Admitting: Obstetrics

## 2023-03-01 ENCOUNTER — Ambulatory Visit (INDEPENDENT_AMBULATORY_CARE_PROVIDER_SITE_OTHER): Payer: 59 | Admitting: Obstetrics

## 2023-03-01 VITALS — BP 123/79 | HR 71 | Ht 66.0 in | Wt 258.0 lb

## 2023-03-01 DIAGNOSIS — Z30431 Encounter for routine checking of intrauterine contraceptive device: Secondary | ICD-10-CM

## 2023-03-01 NOTE — Progress Notes (Signed)
   GYN ENCOUNTER  Subjective  HPI: Autumn [name] is a 22 y.o. G0P0000 who presents today for an IUD check. She had her [place] IUD placed 06/25/22. She initially had irregular bleeding, and then it stabilized. The past 2 months she has had very light, irregular bleeding. Her strings feel longer to her than they initially did. She had a period of heavy cramping that eventually resolved. She has had some nausea, headache, and breast tenderness. She had a negative UPT today.  History reviewed. No pertinent past medical history. Past Surgical History:  Procedure Laterality Date   APPENDECTOMY     OB History     Gravida  0   Para  0   Term  0   Preterm  0   AB  0   Living  0      SAB  0   IAB  0   Ectopic  0   Multiple  0   Live Births  0          No Known Allergies  Constitutional: Denied constitutional symptoms, night sweats, recent illness, fatigue, fever, insomnia and weight loss.  Genitourinary: See HPI    Objective  BP 123/79   Pulse 71   Ht 5\' 6"  (1.676 m)   Wt 258 lb (117 kg)   BMI 41.64 kg/m   Physical examination   Pelvic:   Vulva: Normal appearance.  No lesions.  Vagina: No lesions or abnormalities noted.  Cervix: Normal appearance.  No lesions.IUD strings visible, approximately 4-5 cm. IUD not visible at cervical os.  Perineum: Normal exam.  No lesions.    Assessment 1) IUD check  Plan  1) Discussed expected changes in bleeding patterns with IUD. Offered pelvic US to confirm placement. Belanna will schedule at her convenience. F/u based on results.   Guadlupe Spanish, CNM

## 2023-03-21 ENCOUNTER — Other Ambulatory Visit: Payer: Self-pay

## 2023-03-21 ENCOUNTER — Telehealth: Payer: Self-pay | Admitting: Obstetrics

## 2023-03-21 NOTE — Telephone Encounter (Signed)
Reached out to pt about [place] that was scheduled on 03/21/2023 at 9:15 (per Guadlupe Spanish).  Left message for pt to call back.

## 2023-03-22 ENCOUNTER — Encounter: Payer: Self-pay | Admitting: Obstetrics

## 2023-03-22 NOTE — Telephone Encounter (Signed)
Reached out to pt (2x) about [place] that scheduled on 03/21/2023 at 9:15 (per Guadlupe Spanish).  Left message for pt to call back.  Will send a MyChart letter.

## 2023-03-23 ENCOUNTER — Other Ambulatory Visit: Payer: Self-pay | Admitting: Nurse Practitioner

## 2023-03-23 DIAGNOSIS — I10 Essential (primary) hypertension: Secondary | ICD-10-CM

## 2023-03-23 NOTE — Telephone Encounter (Signed)
Requested Prescriptions  Pending Prescriptions Disp Refills   lisinopril (ZESTRIL) 2.5 MG tablet [Pharmacy Med Name: LISINOPRIL 2.5 MG TABLET] 30 tablet 0    Sig: TAKE 1 TABLET BY MOUTH EVERY DAY     Cardiovascular:  ACE Inhibitors Failed - 03/23/2023  2:11 AM      Failed - Cr in normal range and within 180 days    Creat  Date Value Ref Range Status  05/10/2022 0.81 0.50 - 0.96 mg/dL Final         Failed - K in normal range and within 180 days    Potassium  Date Value Ref Range Status  05/10/2022 4.5 3.5 - 5.3 mmol/L Final         Failed - Valid encounter within last 6 months    Recent Outpatient Visits           8 months ago Moderate episode of recurrent major depressive disorder [facility])   [facility] Berniece Salines, FNP   9 months ago Moderate episode of recurrent major depressive disorder [facility])   [facility] Berniece Salines, FNP   10 months ago Hypertension, unspecified type   [facility] Berniece Salines, [place]              Passed - Patient is not pregnant      Passed - Last BP in normal range    BP Readings from Last 1 Encounters:  03/01/23 123/79

## 2023-03-25 ENCOUNTER — Encounter: Payer: Self-pay | Admitting: Obstetrics

## 2023-04-01 ENCOUNTER — Other Ambulatory Visit: Payer: Self-pay | Admitting: Nurse Practitioner

## 2023-04-01 DIAGNOSIS — F419 Anxiety disorder, unspecified: Secondary | ICD-10-CM

## 2023-04-01 DIAGNOSIS — F331 Major depressive disorder, recurrent, moderate: Secondary | ICD-10-CM

## 2023-04-01 NOTE — Telephone Encounter (Signed)
Requested Prescriptions  Pending Prescriptions Disp Refills   escitalopram (LEXAPRO) 20 MG tablet [Pharmacy Med Name: ESCITALOPRAM 20 MG TABLET] 30 tablet 0    Sig: TAKE 1 TABLET BY MOUTH EVERY DAY     Psychiatry:  Antidepressants - SSRI Failed - 04/01/2023  2:23 AM      Failed - Valid encounter within last 6 months    Recent Outpatient Visits           9 months ago Moderate episode of recurrent major depressive disorder [facility])   [facility] Berniece Salines, FNP   9 months ago Moderate episode of recurrent major depressive disorder [facility])   [facility] Berniece Salines, FNP   10 months ago Hypertension, unspecified type   [facility] Berniece Salines, FNP              Passed - Completed PHQ-2 or PHQ-9 in the last 360 days       Sent pt MyChart message to make appt.

## 2023-04-07 ENCOUNTER — Other Ambulatory Visit: Payer: 59

## 2023-04-15 ENCOUNTER — Other Ambulatory Visit: Payer: Self-pay | Admitting: Nurse Practitioner

## 2023-04-15 DIAGNOSIS — F331 Major depressive disorder, recurrent, moderate: Secondary | ICD-10-CM

## 2023-04-15 DIAGNOSIS — F419 Anxiety disorder, unspecified: Secondary | ICD-10-CM

## 2023-04-15 NOTE — Telephone Encounter (Signed)
30-day courtesy refill given. Patient needs to schedule appt. Requested Prescriptions  Pending Prescriptions Disp Refills   escitalopram (LEXAPRO) 20 MG tablet [Pharmacy Med Name: ESCITALOPRAM 20 MG TABLET] 90 tablet 1    Sig: TAKE 1 TABLET BY MOUTH EVERY DAY     Psychiatry:  Antidepressants - SSRI Failed - 04/15/2023  9:06 AM      Failed - Valid encounter within last 6 months    Recent Outpatient Visits           9 months ago Moderate episode of recurrent major depressive disorder [facility])   [facility] Berniece Salines, FNP   10 months ago Moderate episode of recurrent major depressive disorder [facility])   [facility] Berniece Salines, FNP   11 months ago Hypertension, unspecified type   [facility] Berniece Salines, FNP              Passed - Completed PHQ-2 or PHQ-9 in the last 360 days

## 2023-04-26 ENCOUNTER — Other Ambulatory Visit: Payer: Self-pay | Admitting: Nurse Practitioner

## 2023-04-26 DIAGNOSIS — I10 Essential (primary) hypertension: Secondary | ICD-10-CM

## 2023-04-28 NOTE — Telephone Encounter (Signed)
Requested medication (s) are due for refill today: yes  Requested medication (s) are on the active medication list: yes  Last refill:  03/23/23  Future visit scheduled: no  Notes to clinic:  Unable to refill per protocol, courtesy refill already given, routing for provider approval.      Requested Prescriptions  Pending Prescriptions Disp Refills   lisinopril (ZESTRIL) 2.5 MG tablet [Pharmacy Med Name: LISINOPRIL 2.5 MG TABLET] 90 tablet 1    Sig: TAKE 1 TABLET BY MOUTH EVERY DAY     Cardiovascular:  ACE Inhibitors Failed - 04/26/2023  1:31 PM      Failed - Cr in normal range and within 180 days    Creat  Date Value Ref Range Status  05/10/2022 0.81 0.50 - 0.96 mg/dL Final         Failed - K in normal range and within 180 days    Potassium  Date Value Ref Range Status  05/10/2022 4.5 3.5 - 5.3 mmol/L Final         Failed - Valid encounter within last 6 months    Recent Outpatient Visits           9 months ago Moderate episode of recurrent major depressive disorder [facility])   [facility] Berniece Salines, FNP   10 months ago Moderate episode of recurrent major depressive disorder [facility])   [facility] Berniece Salines, FNP   11 months ago Hypertension, unspecified type   [facility] Berniece Salines, [place]              Passed - Patient is not pregnant      Passed - Last BP in normal range    BP Readings from Last 1 Encounters:  03/01/23 123/79

## 2023-05-06 ENCOUNTER — Other Ambulatory Visit: Payer: Self-pay

## 2023-05-06 ENCOUNTER — Emergency Department
Admission: EM | Admit: 2023-05-06 | Discharge: 2023-05-06 | Disposition: A | Discharge status: Home / Self Care | Payer: Worker's Compensation | Attending: Emergency Medicine | Admitting: Emergency Medicine

## 2023-05-06 ENCOUNTER — Emergency Department: Payer: Worker's Compensation

## 2023-05-06 DIAGNOSIS — S060X0A Concussion without loss of consciousness, initial encounter: Secondary | ICD-10-CM | POA: Diagnosis not present

## 2023-05-06 DIAGNOSIS — S0990XA Unspecified injury of head, initial encounter: Secondary | ICD-10-CM

## 2023-05-06 DIAGNOSIS — W228XXA Striking against or struck by other objects, initial encounter: Secondary | ICD-10-CM | POA: Insufficient documentation

## 2023-05-06 DIAGNOSIS — S0083XA Contusion of other part of head, initial encounter: Secondary | ICD-10-CM | POA: Insufficient documentation

## 2023-05-06 MED ORDER — DIPHENHYDRAMINE HCL 25 MG PO CAPS
25.0000 mg | ORAL_CAPSULE | Freq: Once | ORAL | Status: AC
  Administered 2023-05-06: 25 mg via ORAL
  Filled 2023-05-06: qty 1

## 2023-05-06 MED ORDER — ONDANSETRON 4 MG PO TBDP: 4.0000 mg | ORAL_TABLET | Freq: Three times a day (TID) | ORAL | 0 refills | Status: AC | PRN

## 2023-05-06 MED ORDER — ACETAMINOPHEN 500 MG PO TABS
1000.0000 mg | ORAL_TABLET | Freq: Once | ORAL | Status: AC
  Administered 2023-05-06: 1000 mg via ORAL
  Filled 2023-05-06: qty 2

## 2023-05-06 MED ORDER — ONDANSETRON 4 MG PO TBDP
4.0000 mg | ORAL_TABLET | Freq: Once | ORAL | Status: AC
  Administered 2023-05-06: 4 mg via ORAL
  Filled 2023-05-06: qty 1
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
## 2023-05-06 NOTE — Discharge Instructions (Signed)
Your exam and CT scan are normal and negative for serious head injury. You do have signs of a mild concussion. Take the prescription nausea medicine along with OTC Benadryl and Naproxen as discussed. Drink plenty of fluids and rest as needed.
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
## 2023-05-06 NOTE — ED Notes (Signed)
Labs for worker's comp sent
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
## 2024-08-22 NOTE — Progress Notes (Unsigned)
" ° ° °  GYNECOLOGY OFFICE ENCOUNTER NOTE  History:  23 y.o. G0P0000 here today for today for IUD string check; [place]   IUD was placed  06/25/22. No complaints about the IUD, no concerning side effects.  The following portions of the patient's history were reviewed and updated as appropriate: allergies, current medications, past family history, past medical history, past social history, past surgical history and problem list. Last pap smear on 06/22/22 was normal, negative HRHPV.  Review of Systems:  Pertinent items are noted in HPI.  Objective:  There were no vitals taken for this visit.  Physical Exam CONSTITUTIONAL: Well-developed, well-nourished female in no acute distress.  NEUROLOGIC: Alert and oriented to person, place, and time. Normal reflexes, muscle tone coordination.  ABDOMEN: Soft, no distention noted.   PELVIC: Normal appearing external genitalia; normal appearing vaginal mucosa and cervix.  IUD strings visualized, about *** cm in length outside cervix. Done in the presence of a chaperone.  EXTREMITIES: Non-tender, no edema or cyanosis  Assessment & Plan:  Patient to keep IUD in place for up to *** years; can come in for removal if she desires pregnancy earlier or for any concerning side effects.    Autumn [name] CNM [facility]

## 2024-08-24 ENCOUNTER — Encounter: Payer: Self-pay | Admitting: Obstetrics

## 2024-08-24 ENCOUNTER — Other Ambulatory Visit (HOSPITAL_COMMUNITY)
Admission: RE | Admit: 2024-08-24 | Discharge: 2024-08-24 | Disposition: A | Discharge status: Home / Self Care | Source: Ambulatory Visit | Attending: Obstetrics | Admitting: Obstetrics

## 2024-08-24 ENCOUNTER — Ambulatory Visit: Admitting: Obstetrics

## 2024-08-24 VITALS — BP 118/78 | HR 67 | Ht 66.0 in | Wt 250.0 lb

## 2024-08-24 DIAGNOSIS — Z3202 Encounter for pregnancy test, result negative: Secondary | ICD-10-CM

## 2024-08-24 DIAGNOSIS — T8332XA Displacement of intrauterine contraceptive device, initial encounter: Secondary | ICD-10-CM

## 2024-08-24 DIAGNOSIS — R1022 Pelvic and perineal pain left side: Secondary | ICD-10-CM | POA: Diagnosis not present

## 2024-08-24 DIAGNOSIS — Z30431 Encounter for routine checking of intrauterine contraceptive device: Secondary | ICD-10-CM

## 2024-08-24 DIAGNOSIS — R102 Pelvic and perineal pain unspecified side: Secondary | ICD-10-CM | POA: Insufficient documentation

## 2024-08-24 LAB — POCT URINE PREGNANCY: Preg Test, Ur: NEGATIVE

## 2024-08-27 LAB — CERVICOVAGINAL ANCILLARY ONLY
Bacterial Vaginitis (gardnerella): POSITIVE — AB
Candida Glabrata: NEGATIVE
Candida Vaginitis: NEGATIVE
Chlamydia: NEGATIVE
Comment: NEGATIVE
Comment: NEGATIVE
Comment: NEGATIVE
Comment: NEGATIVE
Comment: NEGATIVE
Comment: NORMAL
Neisseria Gonorrhea: NEGATIVE
Trichomonas: NEGATIVE

## 2024-08-31 ENCOUNTER — Other Ambulatory Visit: Payer: Self-pay | Admitting: Obstetrics

## 2024-08-31 ENCOUNTER — Encounter: Payer: Self-pay | Admitting: Obstetrics

## 2024-08-31 DIAGNOSIS — B9689 Other specified bacterial agents as the cause of diseases classified elsewhere: Secondary | ICD-10-CM | POA: Insufficient documentation

## 2024-08-31 MED ORDER — METRONIDAZOLE 500 MG PO TABS: 500.0000 mg | ORAL_TABLET | Freq: Two times a day (BID) | ORAL | 0 refills | Status: AC

## 2024-08-31 NOTE — Progress Notes (Signed)
+  BV. Rx for metronidazole  500 mg PO BID x 7 days sent to pharmacy. [place] notified via MyChart.  M. Justino, CNM

## 2024-09-10 ENCOUNTER — Ambulatory Visit

## 2024-09-10 DIAGNOSIS — R102 Pelvic and perineal pain unspecified side: Secondary | ICD-10-CM | POA: Diagnosis not present

## 2024-09-10 DIAGNOSIS — Z30431 Encounter for routine checking of intrauterine contraceptive device: Secondary | ICD-10-CM | POA: Diagnosis not present

## 2024-09-19 ENCOUNTER — Ambulatory Visit: Payer: Self-pay | Admitting: Obstetrics
